# Patient Record
Sex: Female | Born: 1970 | Race: White | Hispanic: No | Marital: Married | Smoking: Former smoker
Health system: Southern US, Community
[De-identification: ages and names within clinical notes are randomized; demographics above are authoritative.]

## PROBLEM LIST (undated history)

## (undated) DIAGNOSIS — K8020 Calculus of gallbladder without cholecystitis without obstruction: Secondary | ICD-10-CM

## (undated) DIAGNOSIS — J45909 Unspecified asthma, uncomplicated: Secondary | ICD-10-CM

## (undated) DIAGNOSIS — M797 Fibromyalgia: Secondary | ICD-10-CM

## (undated) HISTORY — PX: HERNIA REPAIR: SHX51

---

## 2016-05-14 ENCOUNTER — Emergency Department (HOSPITAL_COMMUNITY): Payer: No Typology Code available for payment source

## 2016-05-14 ENCOUNTER — Emergency Department (HOSPITAL_COMMUNITY)
Admission: EM | Admit: 2016-05-14 | Discharge: 2016-05-15 | Disposition: A | Discharge status: Home / Self Care | Payer: No Typology Code available for payment source | Attending: Emergency Medicine | Admitting: Emergency Medicine

## 2016-05-14 ENCOUNTER — Other Ambulatory Visit: Payer: Self-pay

## 2016-05-14 ENCOUNTER — Encounter (HOSPITAL_COMMUNITY): Payer: Self-pay | Admitting: Emergency Medicine

## 2016-05-14 DIAGNOSIS — Y939 Activity, unspecified: Secondary | ICD-10-CM | POA: Insufficient documentation

## 2016-05-14 DIAGNOSIS — Z87891 Personal history of nicotine dependence: Secondary | ICD-10-CM | POA: Diagnosis not present

## 2016-05-14 DIAGNOSIS — Y999 Unspecified external cause status: Secondary | ICD-10-CM | POA: Insufficient documentation

## 2016-05-14 DIAGNOSIS — Y92410 Unspecified street and highway as the place of occurrence of the external cause: Secondary | ICD-10-CM | POA: Insufficient documentation

## 2016-05-14 DIAGNOSIS — S60512A Abrasion of left hand, initial encounter: Secondary | ICD-10-CM | POA: Insufficient documentation

## 2016-05-14 DIAGNOSIS — S8012XA Contusion of left lower leg, initial encounter: Secondary | ICD-10-CM | POA: Insufficient documentation

## 2016-05-14 DIAGNOSIS — M25512 Pain in left shoulder: Secondary | ICD-10-CM | POA: Diagnosis not present

## 2016-05-14 DIAGNOSIS — S8991XA Unspecified injury of right lower leg, initial encounter: Secondary | ICD-10-CM | POA: Diagnosis present

## 2016-05-14 DIAGNOSIS — J45909 Unspecified asthma, uncomplicated: Secondary | ICD-10-CM | POA: Insufficient documentation

## 2016-05-14 DIAGNOSIS — S00531A Contusion of lip, initial encounter: Secondary | ICD-10-CM | POA: Diagnosis not present

## 2016-05-14 HISTORY — DX: Calculus of gallbladder without cholecystitis without obstruction: K80.20

## 2016-05-14 HISTORY — DX: Unspecified asthma, uncomplicated: J45.909

## 2016-05-14 HISTORY — DX: Fibromyalgia: M79.7
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
## 2016-05-14 NOTE — ED Notes (Signed)
Pt states she is feeling better and not short of breath anymore.

## 2016-05-14 NOTE — ED Notes (Signed)
  Pt transported to ct 
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
## 2016-05-14 NOTE — ED Notes (Signed)
See EDP assessment 

## 2016-05-15 MED ORDER — CYCLOBENZAPRINE HCL 10 MG PO TABS
5.0000 mg | ORAL_TABLET | Freq: Once | ORAL | Status: DC
  Filled 2016-05-15: qty 1

## 2016-05-15 MED ORDER — NAPROXEN 250 MG PO TABS
500.0000 mg | ORAL_TABLET | Freq: Once | ORAL | Status: AC
  Administered 2016-05-15: 500 mg via ORAL
  Filled 2016-05-15: qty 2

## 2016-05-15 MED ORDER — NAPROXEN 500 MG PO TABS: 500.0000 mg | ORAL_TABLET | Freq: Two times a day (BID) | ORAL | 0 refills | Status: AC

## 2016-05-15 MED ORDER — CYCLOBENZAPRINE HCL 5 MG PO TABS: 5.0000 mg | ORAL_TABLET | Freq: Three times a day (TID) | ORAL | 0 refills | Status: AC | PRN

## 2016-05-15 NOTE — ED Notes (Signed)
Pt refused Flexeril.

## 2016-05-15 NOTE — Discharge Instructions (Signed)
Read the information below.  Your x-rays were re-assuring. I have provided a knee sleeve and an arm sling for added support.  You may feel sore for the next 2-3 days. I have prescribed naprosyn and flexeril for relief. While taking naprosyn do not take other NSAIDs (ibuprofen, motrin, or aleve). Flexeril can make you drowsy, do not drive after taking.  You can apply heat/ice to affected areas for 20 minute increments.  Warm showers can soothe sore muscles.  If symptoms persist for more than a week follow up with your primary provider.  Use the prescribed medication as directed.  Please discuss all new medications with your pharmacist.   You may return to the Emergency Department at any time for worsening condition or any new symptoms that concern you.
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
